# Patient Record
Sex: Male | Born: 1995 | Race: White | Hispanic: No | Marital: Single | Smoking: Former smoker
Health system: Southern US, Community
[De-identification: ages and names within clinical notes are randomized; demographics above are authoritative.]

## PROBLEM LIST (undated history)

## (undated) DIAGNOSIS — R51 Headache: Secondary | ICD-10-CM

## (undated) DIAGNOSIS — J189 Pneumonia, unspecified organism: Secondary | ICD-10-CM

## (undated) DIAGNOSIS — R519 Headache, unspecified: Secondary | ICD-10-CM

## (undated) HISTORY — PX: EYE SURGERY: SHX253

---

## 2014-05-02 ENCOUNTER — Encounter (HOSPITAL_BASED_OUTPATIENT_CLINIC_OR_DEPARTMENT_OTHER): Payer: Self-pay

## 2014-05-02 ENCOUNTER — Emergency Department (HOSPITAL_BASED_OUTPATIENT_CLINIC_OR_DEPARTMENT_OTHER): Payer: Medicaid Other

## 2014-05-02 ENCOUNTER — Emergency Department (HOSPITAL_BASED_OUTPATIENT_CLINIC_OR_DEPARTMENT_OTHER)
Admission: EM | Admit: 2014-05-02 | Discharge: 2014-05-02 | Disposition: A | Discharge status: Home / Self Care | Payer: Medicaid Other | Attending: Emergency Medicine | Admitting: Emergency Medicine

## 2014-05-02 DIAGNOSIS — Y9389 Activity, other specified: Secondary | ICD-10-CM | POA: Diagnosis not present

## 2014-05-02 DIAGNOSIS — W228XXA Striking against or struck by other objects, initial encounter: Secondary | ICD-10-CM | POA: Diagnosis not present

## 2014-05-02 DIAGNOSIS — S62339A Displaced fracture of neck of unspecified metacarpal bone, initial encounter for closed fracture: Secondary | ICD-10-CM

## 2014-05-02 DIAGNOSIS — S62316A Displaced fracture of base of fifth metacarpal bone, right hand, initial encounter for closed fracture: Secondary | ICD-10-CM | POA: Insufficient documentation

## 2014-05-02 DIAGNOSIS — Y998 Other external cause status: Secondary | ICD-10-CM | POA: Insufficient documentation

## 2014-05-02 DIAGNOSIS — S6991XA Unspecified injury of right wrist, hand and finger(s), initial encounter: Secondary | ICD-10-CM | POA: Diagnosis present

## 2014-05-02 DIAGNOSIS — Y9289 Other specified places as the place of occurrence of the external cause: Secondary | ICD-10-CM | POA: Insufficient documentation

## 2014-05-02 MED ORDER — HYDROCODONE-ACETAMINOPHEN 5-325 MG PO TABS: 1.0000 | ORAL_TABLET | ORAL | Status: AC | PRN
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
## 2014-05-02 NOTE — Discharge Instructions (Signed)
Boxer's Fracture Follow-up tomorrow with Dr. Mina MarbleWeingold. Take the pain medication as prescribed. Return to the ED if you develop new or worsening symptoms. You have a break (fracture) of the fifth metacarpal bone. This is commonly called a boxer's fracture. This is the bone in the hand where the little finger attaches. The fracture is in the end of that bone, closest to the little finger. It is usually caused when you hit an object with a clenched fist. Often, the knuckle is pushed down by the impact. Sometimes, the fracture rotates out of position. A boxer's fracture will usually heal within 6 weeks, if it is treated properly and protected from re-injury. Surgery is sometimes needed. A cast, splint, or bulky hand dressing may be used to protect and immobilize a boxer's fracture. Do not remove this device or dressing until your caregiver approves. Keep your hand elevated, and apply ice packs for 15-20 minutes every 2 hours, for the first 2 days. Elevation and ice help reduce swelling and relieve pain. See your caregiver, or an orthopedic specialist, for follow-up care within the next 10 days. This is to make sure your fracture is healing properly. Document Released: 01/12/2005 Document Revised: 04/06/2011 Document Reviewed: 07/02/2006 [facility] Patient Information 2015 [employer] [place], [place]. This information is not intended to replace advice given to you by your health care provider. Make sure you discuss any questions you have with your health care provider.

## 2014-05-03 ENCOUNTER — Other Ambulatory Visit: Payer: Self-pay | Admitting: Orthopedic Surgery

## 2014-05-04 ENCOUNTER — Encounter (HOSPITAL_COMMUNITY): Payer: Self-pay | Admitting: *Deleted

## 2014-05-04 MED ORDER — CHLORHEXIDINE GLUCONATE 4 % EX LIQD
60.0000 mL | Freq: Once | CUTANEOUS | Status: DC
  Filled 2014-05-04: qty 60

## 2014-05-04 MED ORDER — CEFAZOLIN SODIUM-DEXTROSE 2-3 GM-% IV SOLR
2.0000 g | INTRAVENOUS | Status: AC
  Administered 2014-05-05: 2 g via INTRAVENOUS
  Filled 2014-05-04: qty 50

## 2014-05-04 NOTE — Progress Notes (Signed)
Pt denies SOB, chest pain, and being under the care of a cardiologist. Pt denies having a chest x ray and EKG within the last year. Pt denies having a stress test, echo and cardiac cath.Pt made aware to stop Aspirin, otc vitamins, NSAID's and herbal medications. Pt verbalized understanding of all pre-op instructions.

## 2014-05-05 ENCOUNTER — Encounter (HOSPITAL_COMMUNITY): Payer: Self-pay | Admitting: *Deleted

## 2014-05-05 ENCOUNTER — Ambulatory Visit (HOSPITAL_COMMUNITY): Payer: Medicaid Other | Admitting: Anesthesiology

## 2014-05-05 ENCOUNTER — Ambulatory Visit (HOSPITAL_COMMUNITY)
Admission: RE | Admit: 2014-05-05 | Discharge: 2014-05-05 | Disposition: A | Discharge status: Home / Self Care | Payer: Medicaid Other | Source: Ambulatory Visit | Attending: Orthopedic Surgery | Admitting: Orthopedic Surgery

## 2014-05-05 ENCOUNTER — Encounter (HOSPITAL_COMMUNITY)
Admission: RE | Disposition: A | Discharge status: Home / Self Care | Payer: Self-pay | Source: Ambulatory Visit | Attending: Orthopedic Surgery

## 2014-05-05 DIAGNOSIS — S62336A Displaced fracture of neck of fifth metacarpal bone, right hand, initial encounter for closed fracture: Secondary | ICD-10-CM | POA: Insufficient documentation

## 2014-05-05 DIAGNOSIS — Z87891 Personal history of nicotine dependence: Secondary | ICD-10-CM | POA: Diagnosis not present

## 2014-05-05 DIAGNOSIS — X58XXXA Exposure to other specified factors, initial encounter: Secondary | ICD-10-CM | POA: Insufficient documentation

## 2014-05-05 HISTORY — DX: Pneumonia, unspecified organism: J18.9

## 2014-05-05 HISTORY — DX: Headache: R51

## 2014-05-05 HISTORY — PX: CLOSED REDUCTION METACARPAL WITH PERCUTANEOUS PINNING: SHX5613

## 2014-05-05 HISTORY — DX: Headache, unspecified: R51.9

## 2014-05-05 SURGERY — CLOSED REDUCTION, FRACTURE, METACARPAL BONE, WITH PERCUTANEOUS PINNING
Anesthesia: General | Site: Arm Lower | Laterality: Right

## 2014-05-05 MED ORDER — HYDROMORPHONE HCL 1 MG/ML IJ SOLN
INTRAMUSCULAR | Status: AC
  Filled 2014-05-05: qty 1

## 2014-05-05 MED ORDER — BUPIVACAINE HCL (PF) 0.25 % IJ SOLN
INTRAMUSCULAR | Status: AC
  Filled 2014-05-05: qty 30

## 2014-05-05 MED ORDER — MIDAZOLAM HCL 5 MG/5ML IJ SOLN
INTRAMUSCULAR | Status: DC | PRN
  Administered 2014-05-05: 2 mg via INTRAVENOUS

## 2014-05-05 MED ORDER — DEXAMETHASONE SODIUM PHOSPHATE 4 MG/ML IJ SOLN
INTRAMUSCULAR | Status: AC
  Filled 2014-05-05: qty 2

## 2014-05-05 MED ORDER — OXYCODONE HCL 5 MG/5ML PO SOLN: 5.0000 mg | Freq: Once | ORAL | Status: DC | PRN

## 2014-05-05 MED ORDER — BUPIVACAINE HCL 0.25 % IJ SOLN
INTRAMUSCULAR | Status: DC | PRN
  Administered 2014-05-05: 5 mL

## 2014-05-05 MED ORDER — LACTATED RINGERS IV SOLN: INTRAVENOUS | Status: DC

## 2014-05-05 MED ORDER — LIDOCAINE HCL (CARDIAC) 20 MG/ML IV SOLN
INTRAVENOUS | Status: AC
  Filled 2014-05-05: qty 5

## 2014-05-05 MED ORDER — LACTATED RINGERS IV SOLN
INTRAVENOUS | Status: DC | PRN
  Administered 2014-05-05: 07:00:00 via INTRAVENOUS

## 2014-05-05 MED ORDER — 0.9 % SODIUM CHLORIDE (POUR BTL) OPTIME
TOPICAL | Status: DC | PRN
  Administered 2014-05-05 (×2): 1000 mL

## 2014-05-05 MED ORDER — DEXAMETHASONE SODIUM PHOSPHATE 10 MG/ML IJ SOLN
INTRAMUSCULAR | Status: DC | PRN
  Administered 2014-05-05: 10 mg via INTRAVENOUS

## 2014-05-05 MED ORDER — HYDROMORPHONE HCL 1 MG/ML IJ SOLN
0.2500 mg | INTRAMUSCULAR | Status: DC | PRN
  Administered 2014-05-05: 0.5 mg via INTRAVENOUS

## 2014-05-05 MED ORDER — ONDANSETRON HCL 4 MG/2ML IJ SOLN
INTRAMUSCULAR | Status: AC
  Filled 2014-05-05: qty 2

## 2014-05-05 MED ORDER — ONDANSETRON HCL 4 MG/2ML IJ SOLN
INTRAMUSCULAR | Status: DC | PRN
  Administered 2014-05-05: 4 mg via INTRAVENOUS

## 2014-05-05 MED ORDER — OXYCODONE-ACETAMINOPHEN 5-325 MG PO TABS: 1.0000 | ORAL_TABLET | ORAL | Status: AC | PRN

## 2014-05-05 MED ORDER — PROMETHAZINE HCL 25 MG/ML IJ SOLN
6.2500 mg | INTRAMUSCULAR | Status: DC | PRN
Start: 2014-05-05 — End: 2014-05-05

## 2014-05-05 MED ORDER — MIDAZOLAM HCL 2 MG/2ML IJ SOLN
INTRAMUSCULAR | Status: AC
  Filled 2014-05-05: qty 2

## 2014-05-05 MED ORDER — OXYCODONE HCL 5 MG PO TABS: 5.0000 mg | ORAL_TABLET | Freq: Once | ORAL | Status: DC | PRN

## 2014-05-05 MED ORDER — FENTANYL CITRATE 0.05 MG/ML IJ SOLN
INTRAMUSCULAR | Status: DC | PRN
  Administered 2014-05-05 (×2): 50 ug via INTRAVENOUS
  Administered 2014-05-05: 100 ug via INTRAVENOUS
  Administered 2014-05-05: 50 ug via INTRAVENOUS

## 2014-05-05 MED ORDER — FENTANYL CITRATE 0.05 MG/ML IJ SOLN
INTRAMUSCULAR | Status: AC
  Filled 2014-05-05: qty 5

## 2014-05-05 MED ORDER — PROPOFOL 10 MG/ML IV BOLUS
INTRAVENOUS | Status: DC | PRN
  Administered 2014-05-05: 200 mg via INTRAVENOUS

## 2014-05-05 MED ORDER — PROPOFOL 10 MG/ML IV BOLUS
INTRAVENOUS | Status: AC
  Filled 2014-05-05: qty 20

## 2014-05-05 MED ORDER — LIDOCAINE HCL (CARDIAC) 20 MG/ML IV SOLN
INTRAVENOUS | Status: DC | PRN
  Administered 2014-05-05: 100 mg via INTRAVENOUS

## 2014-05-05 SURGICAL SUPPLY — 41 items
BANDAGE ELASTIC 3 VELCRO ST LF (GAUZE/BANDAGES/DRESSINGS) ×3 IMPLANT
BANDAGE ELASTIC 4 VELCRO ST LF (GAUZE/BANDAGES/DRESSINGS) ×3 IMPLANT
BLADE SURG ROTATE 9660 (MISCELLANEOUS) IMPLANT
BNDG CMPR 9X4 STRL LF SNTH (GAUZE/BANDAGES/DRESSINGS)
BNDG ESMARK 4X9 LF (GAUZE/BANDAGES/DRESSINGS) IMPLANT
BNDG GAUZE ELAST 4 BULKY (GAUZE/BANDAGES/DRESSINGS) ×3 IMPLANT
CORDS BIPOLAR (ELECTRODE) IMPLANT
COVER SURGICAL LIGHT HANDLE (MISCELLANEOUS) ×3 IMPLANT
CUFF TOURNIQUET SINGLE 18IN (TOURNIQUET CUFF) ×3 IMPLANT
CUFF TOURNIQUET SINGLE 24IN (TOURNIQUET CUFF) IMPLANT
DRAPE OEC MINIVIEW 54X84 (DRAPES) ×3 IMPLANT
DRAPE SURG 17X23 STRL (DRAPES) ×3 IMPLANT
GAUZE SPONGE 4X4 12PLY STRL (GAUZE/BANDAGES/DRESSINGS) ×3 IMPLANT
GAUZE XEROFORM 1X8 LF (GAUZE/BANDAGES/DRESSINGS) ×3 IMPLANT
GLOVE SURG SYN 8.0 (GLOVE) ×3 IMPLANT
GOWN STRL REUS W/ TWL LRG LVL3 (GOWN DISPOSABLE) ×4 IMPLANT
GOWN STRL REUS W/ TWL XL LVL3 (GOWN DISPOSABLE) ×1 IMPLANT
GOWN STRL REUS W/TWL LRG LVL3 (GOWN DISPOSABLE) ×12
GOWN STRL REUS W/TWL XL LVL3 (GOWN DISPOSABLE) ×3
KIT BASIN OR (CUSTOM PROCEDURE TRAY) ×3 IMPLANT
KIT ROOM TURNOVER OR (KITS) ×3 IMPLANT
KWIRE 4.0 X .045IN (WIRE) ×6 IMPLANT
MANIFOLD NEPTUNE II (INSTRUMENTS) IMPLANT
NEEDLE HYPO 25GX1X1/2 BEV (NEEDLE) IMPLANT
NS IRRIG 1000ML POUR BTL (IV SOLUTION) ×3 IMPLANT
PACK ORTHO EXTREMITY (CUSTOM PROCEDURE TRAY) ×3 IMPLANT
PAD ARMBOARD 7.5X6 YLW CONV (MISCELLANEOUS) ×3 IMPLANT
PAD CAST 4YDX4 CTTN HI CHSV (CAST SUPPLIES) ×1 IMPLANT
PADDING CAST ABS 3INX4YD NS (CAST SUPPLIES) ×2
PADDING CAST ABS COTTON 3X4 (CAST SUPPLIES) ×1 IMPLANT
PADDING CAST COTTON 4X4 STRL (CAST SUPPLIES) ×3
SPLINT PLASTER CAST XFAST 4X15 (CAST SUPPLIES) ×1 IMPLANT
SPLINT PLASTER XTRA FAST SET 4 (CAST SUPPLIES) ×2
SPONGE LAP 4X18 X RAY DECT (DISPOSABLE) IMPLANT
SUT ETHILON 4 0 PS 2 18 (SUTURE) IMPLANT
SYR CONTROL 10ML LL (SYRINGE) IMPLANT
TOWEL OR 17X24 6PK STRL BLUE (TOWEL DISPOSABLE) ×3 IMPLANT
TOWEL OR 17X26 10 PK STRL BLUE (TOWEL DISPOSABLE) ×3 IMPLANT
TUBE CONNECTING 12'X1/4 (SUCTIONS)
TUBE CONNECTING 12X1/4 (SUCTIONS) IMPLANT
WATER STERILE IRR 1000ML POUR (IV SOLUTION) ×3 IMPLANT

## 2014-05-05 NOTE — Op Note (Signed)
NAMLuisa Hart:  Heinzel, Yon             ACCOUNT NO.:  [id number]  MEDICAL RECORD NO.:  [id number]  LOCATION:  [facility]                         FACILITY:  [facility]  PHYSICIAN:  Artist PaisMatthew A. Maico Mulvehill, M.D.DATE OF BIRTH:  1995-09-27  DATE OF PROCEDURE:  05/05/2014 DATE OF DISCHARGE:                              OPERATIVE REPORT   PREOPERATIVE DIAGNOSIS:  Displaced right small metacarpal fracture.  POSTOPERATIVE DIAGNOSIS:  Displaced right small metacarpal fracture.  PROCEDURE:  Closed reduction, percutaneous pinning above with 045 K- wires x2.  SURGEON:  Artist PaisMatthew A. Mina MarbleWeingold, M.D.  ASSISTANT:  None.  ANESTHESIA:  General.  COMPLICATION:  No complication.  DRAINS:  No drains.  DESCRIPTION OF PROCEDURE:  The patient was taken to the operating suite. After the induction of general anesthetic, right upper extremity was prepped and draped in sterile fashion.  An Esmarch was used to exsanguinate the limb.  Tourniquet was then inflated to 250 mmHg.  At this point in time, longitudinal traction and then a Jahss maneuver was used to reduce the displaced head and neck junction of fracture right small metacarpal.  After reduction was performed, 045 K-wires x2 were driven from the ulnar to radial across the fracture site into the ring metacarpal.  Intraoperative fluoroscopy revealed adequate reduction in AP, lateral, and oblique view.  K-wires were cut outside the skin bent upon themselves.  Dressed with Xeroform, 4x4s, and ulnar gutter splint. The patient tolerated all procedure well in a concealed fashion.     Artist PaisMatthew A. Mina MarbleWeingold, M.D.     MAW/MEDQ  D:  05/05/2014  T:  05/05/2014  Job:  [id number]

## 2014-05-05 NOTE — Anesthesia Preprocedure Evaluation (Signed)
Anesthesia Evaluation    Reviewed: Allergy & Precautions, H&P , NPO status , Patient's Chart, lab work & pertinent test results  Airway        Dental   Pulmonary resolved, former smoker,          Cardiovascular negative cardio ROS      Neuro/Psych negative neurological ROS  negative psych ROS   GI/Hepatic negative GI ROS, Neg liver ROS,   Endo/Other  negative endocrine ROS  Renal/GU negative Renal ROS     Musculoskeletal   Abdominal   Peds  Hematology   Anesthesia Other Findings   Reproductive/Obstetrics negative OB ROS                             Anesthesia Physical Anesthesia Plan  ASA: II  Anesthesia Plan:    Post-op Pain Management:    Induction:   Airway Management Planned:   Additional Equipment:   Intra-op Plan:   Post-operative Plan:   Informed Consent:   Plan Discussed with:   Anesthesia Plan Comments:         Anesthesia Quick Evaluation

## 2014-05-05 NOTE — H&P (Signed)
Marcus Anderson is an 19 y.o. male.   Chief Complaint: right hand pain and deformity HPI: as above s/p right hand trauma with displaced right small metacarpal fracture  Past Medical History  Diagnosis Date  . Pneumonia   . Headache     Past Surgical History  Procedure Laterality Date  . Eye surgery      Family History  Problem Relation Age of Onset  . Heart disease Other    Social History:  reports that he has quit smoking. His smoking use included Cigarettes. He has never used smokeless tobacco. He reports that he drinks alcohol. He reports that he does not use illicit drugs.  Allergies: No Known Allergies  Medications Prior to Admission  Medication Sig Dispense Refill  . HYDROcodone-acetaminophen (NORCO/VICODIN) 5-325 MG per tablet Take 1 tablet by mouth every 4 (four) hours as needed. (Patient taking differently: Take 1 tablet by mouth every 4 (four) hours as needed for moderate pain or severe pain. ) 10 tablet 0    No results found for this or any previous visit (from the past 48 hour(s)). No results found.  Review of Systems  All other systems reviewed and are negative.   Blood pressure 119/65, pulse 61, temperature 97.8 F (36.6 C), temperature source Oral, resp. rate 18, SpO2 99 %. Physical Exam  Constitutional: He is oriented to person, place, and time. He appears well-developed and well-nourished.  HENT:  Head: Normocephalic and atraumatic.  Cardiovascular: Normal rate.   Respiratory: Effort normal.  Musculoskeletal:       Right hand: He exhibits bony tenderness and deformity.  Displaced right small metacarpal fracture  Neurological: He is alert and oriented to person, place, and time.  Skin: Skin is warm.  Psychiatric: He has a normal mood and affect. His behavior is normal. Judgment and thought content normal.     Assessment/Plan As above  Plan CRPP above  Emi Lymon A 05/05/2014, 7:29 AM

## 2014-05-05 NOTE — Op Note (Signed)
See note [id number]

## 2014-05-05 NOTE — Anesthesia Postprocedure Evaluation (Signed)
Anesthesia Post Note  Patient: Marcus Anderson  Procedure(s) Performed: Procedure(s) (LRB): CLOSED REDUCTION METACARPAL WITH PERCUTANEOUS PINNING VERSE OPEN REDUCTION INTERANAL FIXATION RIGHT SMALL METACARPAL FRACTURE (Right)  Anesthesia type: general  Patient location: PACU  Post pain: Pain level controlled  Post assessment: Patient's Cardiovascular Status Stable  Last Vitals:  Filed Vitals:   05/05/14 0830  BP: 123/56  Pulse: 81  Temp:   Resp: 15    Post vital signs: Reviewed and stable  Level of consciousness: sedated  Complications: No apparent anesthesia complications

## 2014-05-05 NOTE — Transfer of Care (Signed)
Immediate Anesthesia Transfer of Care Note  Patient: Marcus Anderson  Procedure(s) Performed: Procedure(s): CLOSED REDUCTION METACARPAL WITH PERCUTANEOUS PINNING VERSE OPEN REDUCTION INTERANAL FIXATION RIGHT SMALL METACARPAL FRACTURE (Right)  Patient Location: PACU  Anesthesia Type:General  Level of Consciousness: awake, alert , oriented and patient cooperative  Airway & Oxygen Therapy: Patient Spontanous Breathing  Post-op Assessment: Report given to RN and Post -op Vital signs reviewed and stable  Post vital signs: Reviewed and stable  Last Vitals:  Filed Vitals:   05/05/14 0815  BP: 124/69  Pulse: 85  Temp: 36.6 C  Resp: 14    Complications: No apparent anesthesia complications

## 2014-05-08 ENCOUNTER — Encounter (HOSPITAL_COMMUNITY): Payer: Self-pay | Admitting: Orthopedic Surgery

## 2014-05-10 ENCOUNTER — Ambulatory Visit: Payer: Medicaid Other | Attending: Orthopedic Surgery | Admitting: Occupational Therapy

## 2014-05-10 ENCOUNTER — Encounter: Payer: Self-pay | Admitting: Occupational Therapy

## 2014-05-10 DIAGNOSIS — M79641 Pain in right hand: Secondary | ICD-10-CM | POA: Insufficient documentation

## 2014-05-10 DIAGNOSIS — M25641 Stiffness of right hand, not elsewhere classified: Secondary | ICD-10-CM

## 2014-05-10 NOTE — Therapy (Signed)
[facility] [address] [place], [place], [address] Phone: [phone]   Fax:  [phone]  Occupational Therapy Evaluation  Patient Details  Name: Marcus Anderson MRN: [id number] Date of Birth: 1995/03/08 Referring Provider:  Charlotte Crumb, MD  Encounter Date: 05/10/2014      OT End of Session - 05/10/14 1131    Visit Number 1   Number of Visits 3   Authorization Type MCD   Authorization Time Period Awaiting authorization   OT Start Time 1015   OT Stop Time 1115   OT Time Calculation (min) 60 min   Equipment Utilized During Treatment splint   Activity Tolerance Patient tolerated treatment well      Past Medical History  Diagnosis Date  . Pneumonia   . Headache     Past Surgical History  Procedure Laterality Date  . Eye surgery    . Closed reduction metacarpal with percutaneous pinning Right 05/05/2014    Procedure: CLOSED REDUCTION METACARPAL WITH PERCUTANEOUS PINNING VERSE OPEN REDUCTION INTERANAL FIXATION RIGHT SMALL METACARPAL FRACTURE;  Surgeon: Charlotte Crumb, MD;  Location: [facility];  Service: Orthopedics;  Laterality: Right;    There were no vitals filed for this visit.  Visit Diagnosis:  Pain in joint, hand, right - Plan: Ot plan of care cert/re-cert  Stiffness of joint, hand, right - Plan: Ot plan of care cert/re-cert      Subjective Assessment - 05/10/14 1019    Subjective  This feels much better (re: splint)   Patient Stated Goals to get a splint   Currently in Pain? Yes   Pain Score 5    Pain Location Hand   Pain Orientation Right   Pain Descriptors / Indicators Aching   Pain Type Surgical pain   Pain Onset In the past 7 days   Pain Frequency Constant   Aggravating Factors  in the am   Pain Relieving Factors pain meds, keeping hand immobolized           [facility] OT Assessment - 05/10/14 1122    Assessment   Diagnosis s/p CRPP Rt small metacarpal fx (85929)  surgery on 05/05/14    Onset Date 05/05/14   Assessment Pt arrived fully wrapped and protected with scheduled appt for splint fabrication. Pt seen for splinting fabrication and pin site care education   Prior Therapy none   Precautions   Precautions --  keep ring/small finger immobolized at MP joint   Required Braces or Orthoses Other Brace/Splint   Other Brace/Splint hand based with MP in 30* flexion buddy strap 4-5th digits per MD orders   Home  Environment   Lives With --  girlfriend who can offer assistance prn   Prior Function   Level of Independence Independent with basic ADLs;Independent with homemaking with ambulation   Vocation --  unknown   ADL   ADL comments Pt reports mod I level with all BADLs except tying shoes.    Written Expression   Dominant Hand Right   Edema   Edema mild at joint near pin sites, but no signs of infection                  OT Treatments/Exercises (OP) - 05/10/14 1127    ADLs   ADL Comments Pt educated in splint wear and care, pin site care and precautions   Splinting   Splinting Fabricated and fitted hand based clam shell (ulnar gutter style) splint with MP's of 4th-5th digits in 30 degrees flexion  and buddy strapped per MD orders. Pt practiced donning/doffing and verbalized understanding with precautions of pin site care.                OT Education - 05/10/14 1105    Education provided Yes   Education Details splint wear and care, pins site care, precautions   Person(s) Educated Patient   Methods Explanation;Handout   Comprehension Verbalized understanding             OT Long Term Goals - 05/10/14 1136    OT LONG TERM GOAL #1   Title Independent with splint wear and care   Baseline issued, may need adjustments   Status On-going   OT LONG TERM GOAL #2   Title Independent w/ precautions   Baseline met at evaluation   Status Achieved   OT LONG TERM GOAL #3   Title Independent with HEP if warranted by MD once fracture healed    Baseline dependent at this time d/t current precautions   Status New               Plan - 05/10/14 1132    Clinical Impression Statement Pt is a 19 y.o. male who presents to outpatient rehab s/p CRPP of Rt (dominant side) small metacarpal fx with scheduled appt. for splint fabrication. Pt arrived fully wrapped and protected. Surgery on 05/05/14.    Pt will benefit from skilled therapeutic intervention in order to improve on the following deficits (Retired) Impaired flexibility;Impaired UE functional use;Pain;Decreased knowledge of precautions;Decreased strength   Rehab Potential Good   OT Frequency --  3 visits over 8 week duration (for splinting adjustments prn and initiation of HEP prn)   OT Treatment/Interventions Self-care/ADL training;Splinting;Patient/family education;Therapeutic exercises;Scar mobilization;Passive range of motion;Manual Therapy   Plan splint adjustments prn, (HEP if warranted by MD once fx healed)   Consulted and Agree with Plan of Care Patient        Problem List There are no active problems to display for this patient.   Carey Bullocks, OTR/L 05/10/2014, 11:43 AM  [facility] [address] [place], [place], [address] Phone: [phone]   Fax:  [phone]

## 2014-05-10 NOTE — Patient Instructions (Signed)
WEARING SCHEDULE:  Wear splint at ALL times except for hygiene care (May remove splint for exercises and then immediately place back on ONLY if directed by the therapist)  PURPOSE:  To prevent movement and for protection until injury can heal  CARE OF SPLINT:  Keep splint away from heat sources including: stove, radiator or furnace, or a car in sunlight. The splint can melt and will no longer fit you properly  Keep away from pets and children  Clean the splint with rubbing alcohol 1-2 times per day.  * During this time, make sure you also clean your hand/arm as instructed by your therapist and/or perform dressing changes as needed. Perform pin site cleaning by dipping Q-tip in hydrogen peroxide and cleaning around base of pins 2x/day. Then dry hand/arm completely before replacing splint. (When cleaning hand/arm, keep it immobilized in same position until splint is replaced)  PRECAUTIONS/POTENTIAL PROBLEMS: *If you notice or experience increased pain, swelling, numbness, or a lingering reddened area from the splint: Contact your therapist immediately by calling [phone]. You must wear the splint for protection, but we will get you scheduled for adjustments as quickly as possible.  (If only straps or hooks need to be replaced and NO adjustments to the splint need to be made, just call the office ahead and let them know you are coming in)  If you have any medical concerns or signs of infection, please call your doctor immediately

## 2016-05-10 IMAGING — CR DG HAND COMPLETE 3+V*R*
3 series · 3 of 3 positions shown · non-contrast
Comparison: None

CLINICAL DATA: Hip the ground with his RIGHT hand around Easter,
having pain, swelling and bruising medially

EXAM:
RIGHT HAND - COMPLETE 3+ VIEW

[x hand pa right]
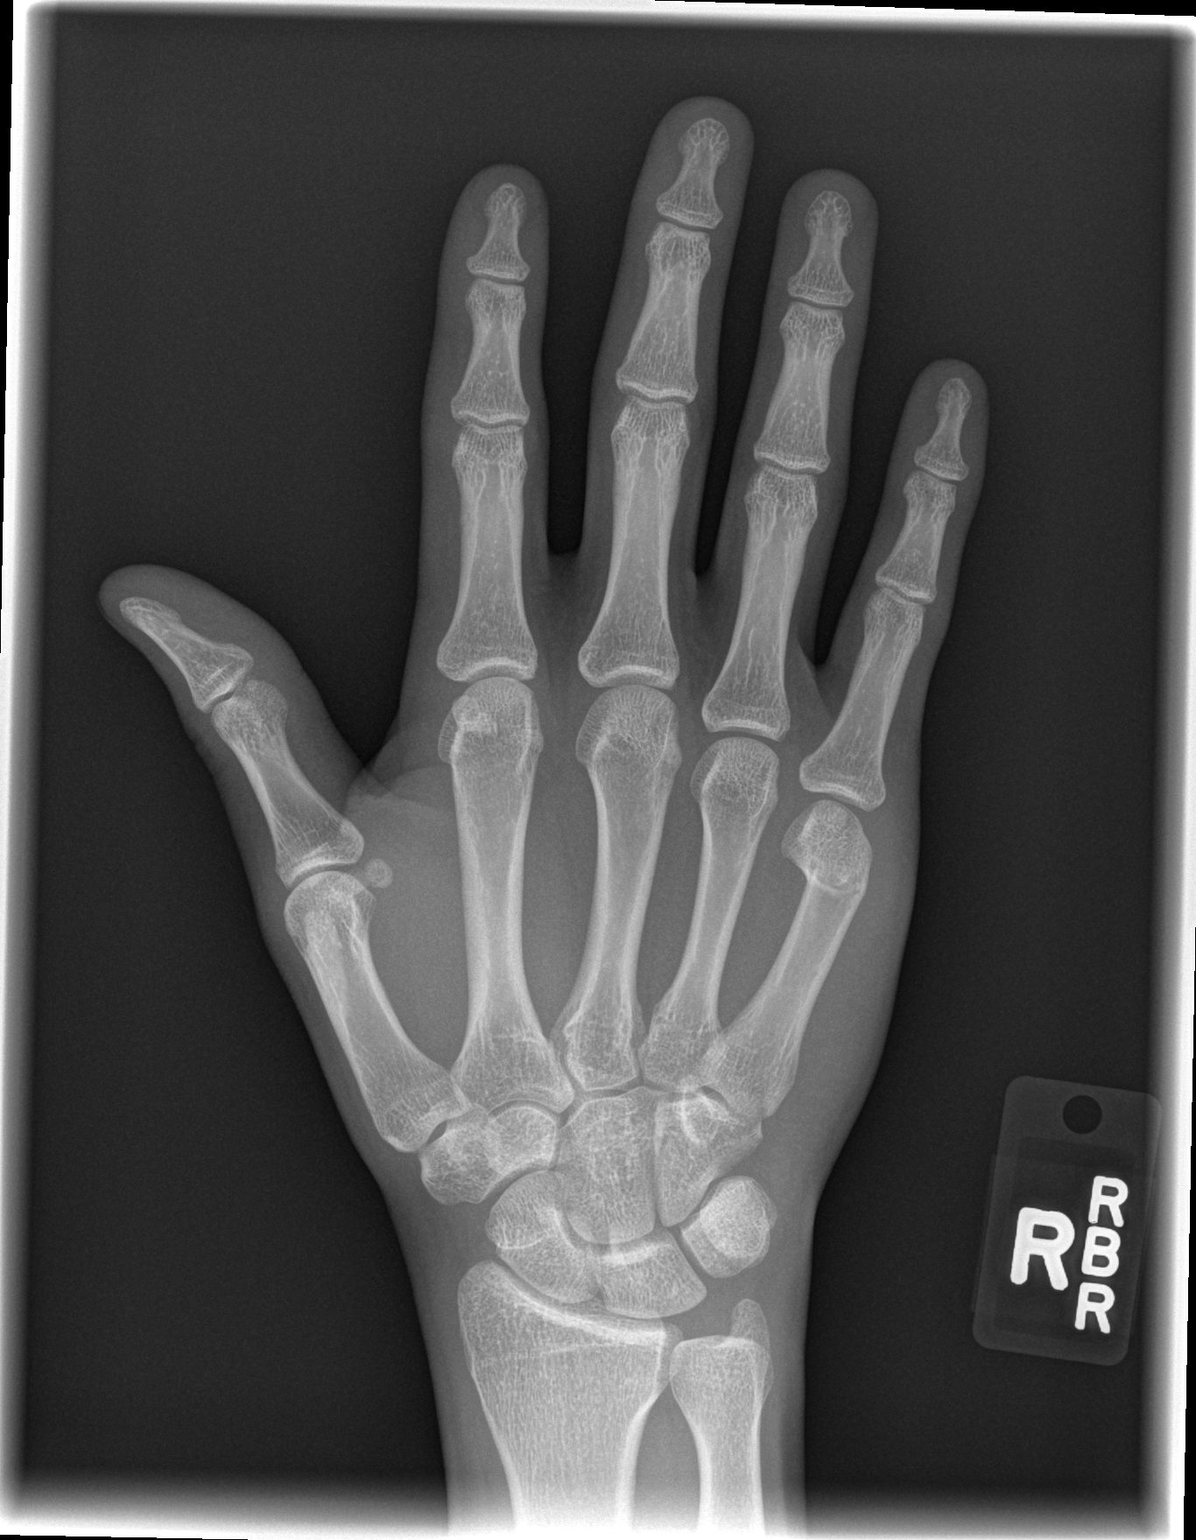

[x hand oblique right]
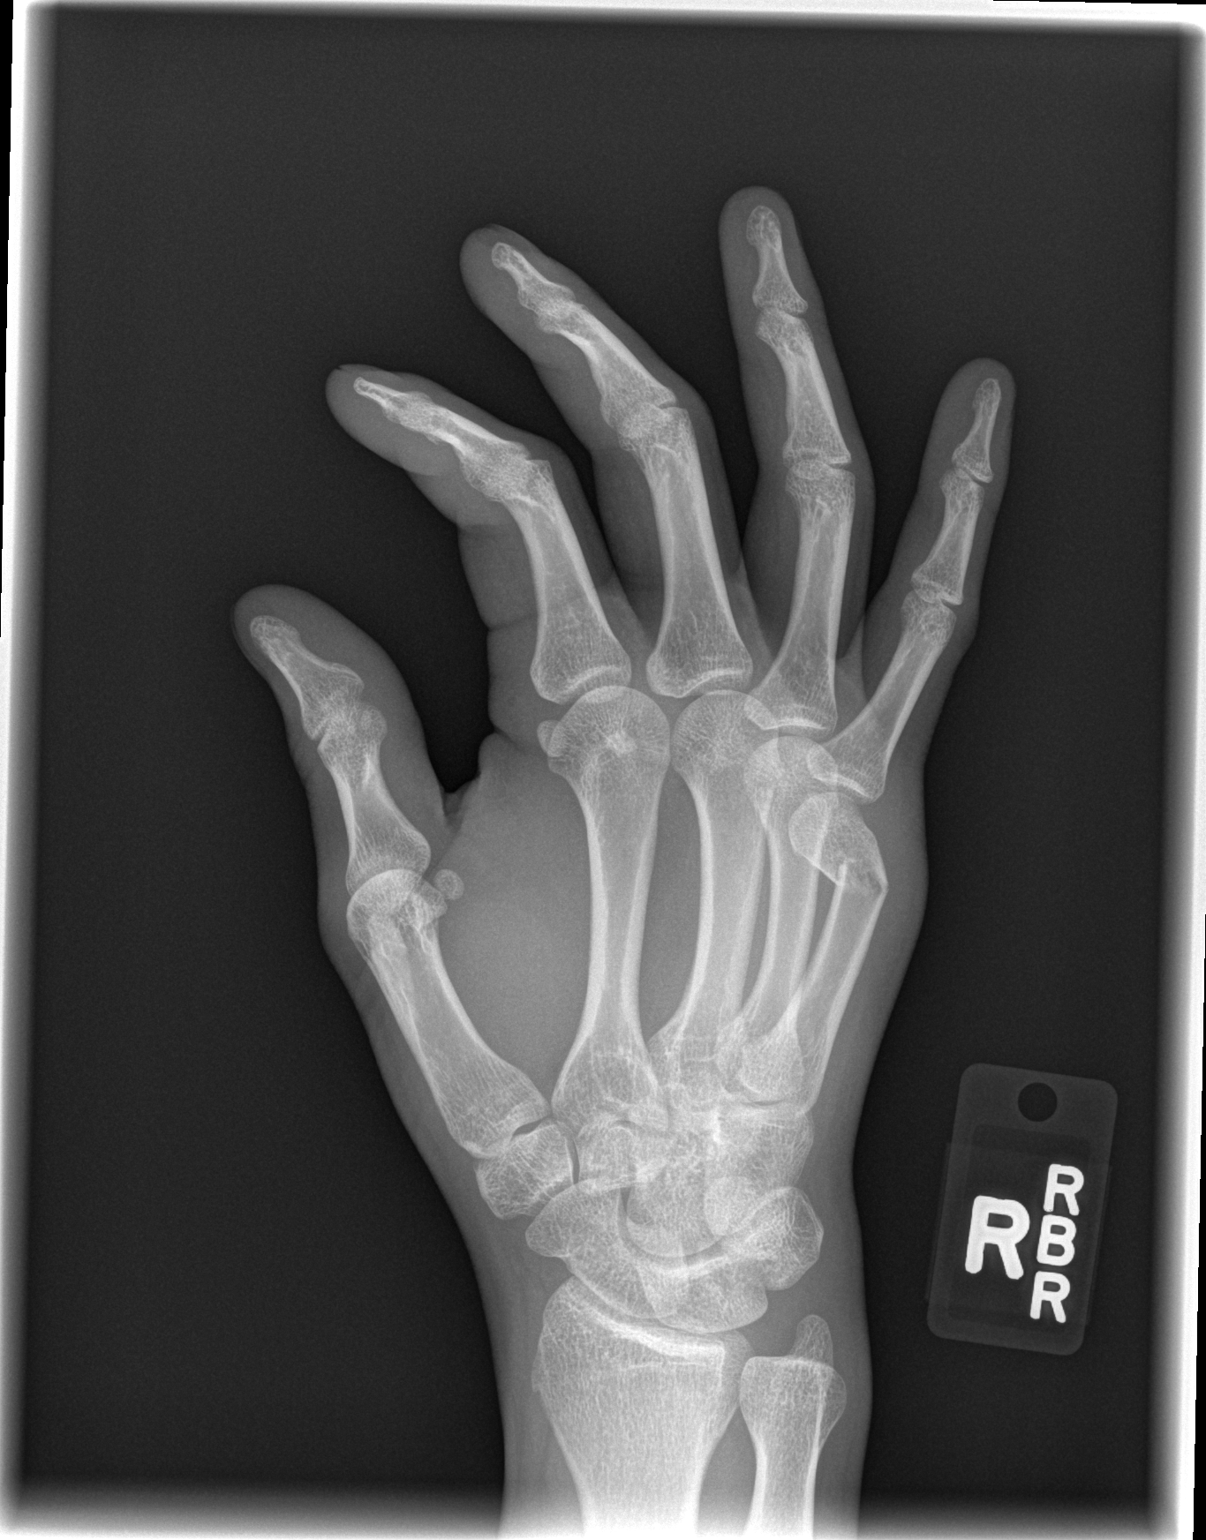

[x hand lat right]
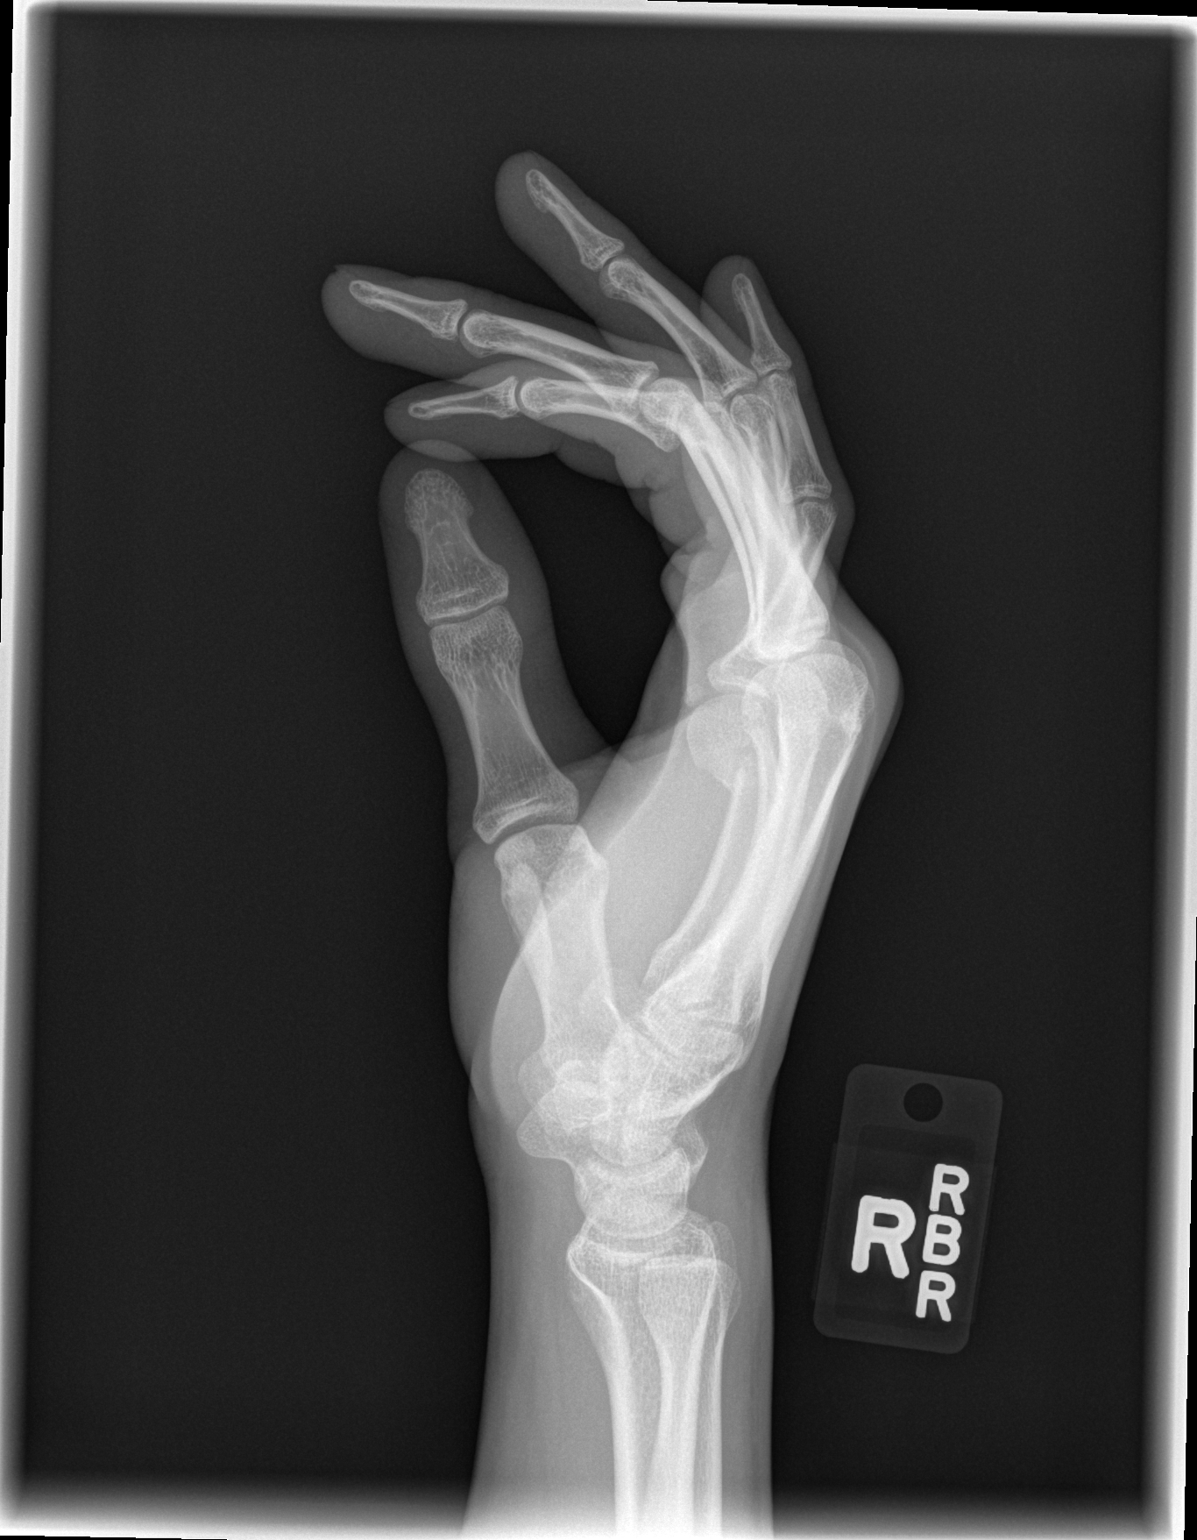

[3 of 3 positions shown; findings below may reference images not displayed]

FINDINGS: Osseous mineralization normal.

Joint spaces preserved.

Distal RIGHT fifth metacarpal fracture with apex ulnar and dorsal
angulation.

No articular extension.

No additional fracture, dislocation, or bone destruction.

Mild overlying soft tissue swelling.
IMPRESSION: Angulated distal RIGHT fifth metacarpal fracture.
